# Patient Record
Sex: Male | Born: 1990 | Race: White | Hispanic: No | Marital: Single | Smoking: Never smoker
Health system: Southern US, Community
[De-identification: ages and names within clinical notes are randomized; demographics above are authoritative.]

## PROBLEM LIST (undated history)

## (undated) DIAGNOSIS — J302 Other seasonal allergic rhinitis: Secondary | ICD-10-CM

## (undated) DIAGNOSIS — K219 Gastro-esophageal reflux disease without esophagitis: Secondary | ICD-10-CM

## (undated) HISTORY — PX: NASAL SINUS SURGERY: SHX719

---

## 2013-02-26 ENCOUNTER — Emergency Department: Payer: Self-pay | Admitting: Emergency Medicine

## 2014-03-23 ENCOUNTER — Emergency Department: Payer: Self-pay | Admitting: Emergency Medicine

## 2014-03-23 LAB — CBC WITH DIFFERENTIAL/PLATELET
BASOS PCT: 0.6 %
Basophil #: 0.1 10*3/uL (ref 0.0–0.1)
Eosinophil #: 0.4 10*3/uL (ref 0.0–0.7)
Eosinophil %: 4.6 %
HCT: 45.1 % (ref 40.0–52.0)
HGB: 15.1 g/dL (ref 13.0–18.0)
LYMPHS ABS: 2.1 10*3/uL (ref 1.0–3.6)
LYMPHS PCT: 22.9 %
MCH: 30.1 pg (ref 26.0–34.0)
MCHC: 33.6 g/dL (ref 32.0–36.0)
MCV: 90 fL (ref 80–100)
MONO ABS: 0.8 x10 3/mm (ref 0.2–1.0)
Monocyte %: 8.6 %
NEUTROS ABS: 5.9 10*3/uL (ref 1.4–6.5)
NEUTROS PCT: 63.3 %
Platelet: 240 10*3/uL (ref 150–440)
RBC: 5.03 10*6/uL (ref 4.40–5.90)
RDW: 14 % (ref 11.5–14.5)
WBC: 9.3 10*3/uL (ref 3.8–10.6)

## 2014-03-23 LAB — BASIC METABOLIC PANEL
Anion Gap: 4 — ABNORMAL LOW (ref 7–16)
BUN: 14 mg/dL (ref 7–18)
CHLORIDE: 105 mmol/L (ref 98–107)
CO2: 31 mmol/L (ref 21–32)
CREATININE: 0.82 mg/dL (ref 0.60–1.30)
Calcium, Total: 9.1 mg/dL (ref 8.5–10.1)
EGFR (African American): >60
Glucose: 92 mg/dL (ref 65–99)
OSMOLALITY: 280 (ref 275–301)
Potassium: 4.1 mmol/L (ref 3.5–5.1)
Sodium: 140 mmol/L (ref 136–145)

## 2015-03-10 NOTE — Consult Note (Signed)
PATIENT NAME:  Troy DuverneyURNER, Reice MR#:  [id number] DATE OF BIRTH:  09-Nov-1991  DATE OF CONSULTATION:    REFERRING PHYSICIAN:   CONSULTING PHYSICIAN:  Zackery BarefootJ. Madison Aily Tzeng, MD  HISTORY OF PRESENT ILLNESS:  The patient is a very pleasant 24 year old gentleman who has a long history of chronic sinusitis and recurrent sinusitis and who woke up at approximately 3:30 this morning with severe eye pain.  He took ibuprofen and Benadryl and went back to sleep.  When he woke back up he presented [facility] inf[facility], was given Benadryl and Augmentin for a sinus infection.  His problem continued to progress at which time at approximately 6:00 this evening he presented to the Emergency Room.  A CT scan was obtained and I have personally reviewed it.  I do agree with the reading of the radiologist that there is a superiorly based abscess in the right orbit displacing the globe inferiorly and laterally.  The patient has been evaluated by the ophthalmologist (Dr. Larae GroomsMatt Appenzeller) and there is no apparent pupillary defect and his visual acuity is normal.  There is pain related limitation of globe and orbital movement.   ALLERGIES:  None.   MEDICATIONS:  Allegra.   PAST MEDICAL HISTORY:   1.  Inhalant allergies.  2.  Asthma.  3.  Recurrent sinusitis.   PAST SURGICAL HISTORY:  [place]inus [place] x 2[place]   PHYSICAL EXAMINATION: GENERAL:  The patient is very pleasant, answers questions appropriately.  Neurologically intact.  EYES:  There is significant periorbital edema and erythema on the right.  The left eye is normal.  The globe is displaced inferolaterally.  Slight limitation in movement in the inferior temporal quadrant.  NOSE:  The intranasal mucosa is diffusely inflamed.  There is polypoid degeneration at the frontal ethmoid recess bilaterally.  There is purulence within the polyps on the right.  ORAL CAVITY AND OROPHARYNX:  Clear.  NECK:  Shotty  adenopathy in levels two and three bilaterally, more significant on the right.   IMPRESSION:  Right superior orbital abscess.  It is unclear as to whether or not this is contiguous with the frontal ethmoid sinus disease, although there frontal ethmoid opacification bilaterally.  I have discussed the case with Dr. Champ MungoAppenzeller who in turn talked with [facility] Mickie KayAmy Fowler at [facility].  She is not completely comfortable with the approach to this type of abscess, therefore [facility] will [facility].  In the meantime, the patient will receive intravenous Rocephin, intravenous Decadron on a q. 6 hour basis and he will remain nothing by mouth.    ____________________________ J. Gertie BaronMadison Cimberly Stoffel, MD jmc:ea D: 03/23/2014 22:59:23 ET T: 03/23/2014 23:25:33 ET JOB#: [id number]  cc: Zackery BarefootJ. Madison Milinda Sweeney, MD, <Dictator> Wendee CoppJMADISON Chrysa Rampy MD ELECTRONICALLY SIGNED 04/16/2014 17:26

## 2015-03-10 NOTE — Consult Note (Signed)
CC: Swollen right [age] yo male presenting with swollen eyelid for past 24 hours or more on the right.  Loss of vision secondary to lid ptosis.  Pain on movement of eye.  Three weeks of sinus infection.  Recent Avelox for treatment.  No improvement with blinking.Annual sinus infections.  History of two sinus surgeries.Currently receiving Ceftaz in the ER.NoneCodeine, DemerolNo SmokingHx: Father with multiple sinus problemsNegative for Endo, GI, GU, Const, Skin, Neuro, MSK, Cardiac, Resp, Heme, Psych Exam: Swollen URL with minimal erythema, and moderate edema.  Vision 20/40 at near.  No APD.  Movement restricted in all four quadrants with pain on medial and lateral movement.  Globe displaced inferior and temporal.  Cornea clear, iris RR, Sclera and Conj White, Lens clear, AC clear Apparent sinus surgery history.  Periosteal abscess superiorly associated with roof.  Question Mucocele Supero-Nasal.  Displaced globe.  Globe intact. 1: Orbital Cellulitis with periosteal abscess.1) Broad Spectrum ABX such as Ceftaz +/- Nafcillin or VancoRef[facility] given history of sinus surgery and orbital roof involvement.  Will need coordination of ENT and Ocuplastics/Orbital Surgery.  Electronic Signatures: Aron BabaAppenzeller, Reannon Candella (MD)  (Signed on 07-May-15 22:00)  Authored  Last Updated: 07-May-15 22:00 by Aron BabaAppenzeller, Aiyonna Lucado (MD)

## 2015-04-11 ENCOUNTER — Encounter (HOSPITAL_COMMUNITY): Payer: Self-pay | Admitting: *Deleted

## 2015-04-11 ENCOUNTER — Emergency Department (HOSPITAL_COMMUNITY)
Admission: EM | Admit: 2015-04-11 | Discharge: 2015-04-11 | Disposition: A | Discharge status: Home / Self Care | Payer: Managed Care, Other (non HMO) | Source: Home / Self Care | Attending: Family Medicine | Admitting: Family Medicine

## 2015-04-11 DIAGNOSIS — M940 Chondrocostal junction syndrome [Tietze]: Secondary | ICD-10-CM

## 2015-04-11 DIAGNOSIS — K219 Gastro-esophageal reflux disease without esophagitis: Secondary | ICD-10-CM

## 2015-04-11 DIAGNOSIS — R079 Chest pain, unspecified: Secondary | ICD-10-CM

## 2015-04-11 HISTORY — DX: Gastro-esophageal reflux disease without esophagitis: K21.9

## 2015-04-11 HISTORY — DX: Other seasonal allergic rhinitis: J30.2

## 2015-04-11 MED ORDER — GI COCKTAIL ~~LOC~~
ORAL | Status: AC
  Filled 2015-04-11: qty 30

## 2015-04-11 MED ORDER — GI COCKTAIL ~~LOC~~: 30.0000 mL | Freq: Once | ORAL | Status: DC

## 2015-04-11 NOTE — ED Notes (Signed)
Pt is here with complaints of 2-3 weeks of fleeting chest pain. Reports pain happens multiple times per day. States it only lasts for a few seconds at a time. Pt denies any additional symptoms. Pt does have history of GERD. States he is "always stressed" with college.

## 2015-04-11 NOTE — ED Provider Notes (Signed)
CSN: [id number]     Arrival date & time 04/11/15  1108 History   First MD Initiated Contact with Patient 04/11/15 1255     Chief Complaint  Patient presents with  . Chest Pain   (Consider location/radiation/quality/duration/timing/severity/associated sxs/prior Treatment) HPI Chest pain. cetner of chest. Sharp and dull. Started a couple weeks ago. Off and non for years. Lasts a couple seconds but occurs multiple times per day. Not worse w/ exertion. Nothing makes symptoms better. Advil w/o benefit. No change w/ deep breathing. She states that he took the [employer] 2 weeks ago which was extremely stressful and patient has also started lifting weights again. Denies nausea, vomiting, lightheadedness, palpitations, radiation to the neck or shoulder or back of the pain, syncope, shortness of breath, fevers, nausea, vomiting, neck stiffness, headache.   Past Medical History  Diagnosis Date  . GERD (gastroesophageal reflux disease)   . Seasonal allergies    Past Surgical History  Procedure Laterality Date  . Nasal sinus surgery     Family History  Problem Relation Age of Onset  . Hypertension Mother   . Diabetes Father   . Cancer Father     prostate  . Hypertension Father    History  Substance Use Topics  . Smoking status: Never Smoker   . Smokeless tobacco: Not on file  . Alcohol Use: Yes     Comment: occasional    Review of Systems Per HPI with all other pertinent systems negative.    Allergies  Codeine and Demerol  Home Medications   Prior to Admission medications   Medication Sig Start Date End Date Taking? Authorizing Provider  esomeprazole (NEXIUM) 20 MG capsule Take 40 mg by mouth daily at 12 noon.   Yes Historical Provider, MD  fexofenadine (ALLEGRA) 30 MG tablet Take 30 mg by mouth 2 (two) times daily.   Yes Historical Provider, MD   BP 146/86 mmHg  Pulse 68  Temp(Src) 98.6 F (37 C) (Oral)  SpO2 99% Physical Exam Physical Exam  Constitutional: oriented to  person, place, and time. appears well-developed and well-nourished. No distress.  HENT:  Head: Normocephalic and atraumatic.  Eyes: EOMI. PERRL.  Neck: Normal range of motion.  Cardiovascular: RRR, no m/r/g, 2+ distal pulses,  Pulmonary/Chest: Effort normal and breath sounds normal. No respiratory distress.  Abdominal: Soft. Bowel sounds are normal. NonTTP, no distension.  Musculoskeletal: Tenderness to palpation of the costochondral border bilaterally. Neurological: alert and oriented to person, place, and time.  Skin: Skin is warm. No rash noted. non diaphoretic.  Psychiatric: normal mood and affect. behavior is normal. Judgment and thought content normal.   ED Course  Procedures (including critical care time) Labs Review Labs Reviewed - No data to display  Imaging Review No results found.   MDM   1. Costochondritis   2. Chest pain, unspecified chest pain type   3. Gastroesophageal reflux disease, esophagitis presence not specified    Patient is very anxious at this point in time. Suspect some of his chest pain is likely due to his stress and anxiety level in addition to some underlying gastric reflux and esophagitis. Patient does not think is related to reflux and does not want a GI cocktail to help rule out out. Patient's tenderness to palpation of his chest wall likely demonstrates that his pain is still skeletal in nature and will be best treated with anti-inflammatories\, and change in physical behavior. Patient aware that anti-inflammatories may worsen his reflux. Patient go to the emergency room  if he gets significantly worse.    Ozella Rocks, MD 04/11/15 [phone]

## 2015-04-11 NOTE — Discharge Instructions (Signed)
It is highly unlikely that the cause of your chest pain is heart related. Your likely suffering from irritation of the costochondral border called costochondritis. Your reflux and stress are also likely contributing to your symptoms. Please consider using an anti-inflammatory medicine to help with the inflammation of your chest. You could consider 600 mg of ibuprofen every 6 hours or 1-2 Aleve every 12 hours. Consider adding additional reflux medications to your regimen such as Carafate or Zantac. Please follow-up with your primary care physician or the emergency room if you get significantly worse.

## 2015-05-13 ENCOUNTER — Emergency Department (HOSPITAL_COMMUNITY)
Admission: EM | Admit: 2015-05-13 | Discharge: 2015-05-13 | Disposition: A | Discharge status: Home / Self Care | Payer: Managed Care, Other (non HMO) | Attending: Emergency Medicine | Admitting: Emergency Medicine

## 2015-05-13 ENCOUNTER — Encounter (HOSPITAL_COMMUNITY): Payer: Self-pay

## 2015-05-13 ENCOUNTER — Emergency Department (HOSPITAL_COMMUNITY): Payer: Managed Care, Other (non HMO)

## 2015-05-13 DIAGNOSIS — W19XXXA Unspecified fall, initial encounter: Secondary | ICD-10-CM

## 2015-05-13 DIAGNOSIS — F10129 Alcohol abuse with intoxication, unspecified: Secondary | ICD-10-CM | POA: Diagnosis not present

## 2015-05-13 DIAGNOSIS — Z7951 Long term (current) use of inhaled steroids: Secondary | ICD-10-CM | POA: Insufficient documentation

## 2015-05-13 DIAGNOSIS — S0101XA Laceration without foreign body of scalp, initial encounter: Secondary | ICD-10-CM

## 2015-05-13 DIAGNOSIS — S060X1A Concussion with loss of consciousness of 30 minutes or less, initial encounter: Secondary | ICD-10-CM | POA: Diagnosis not present

## 2015-05-13 DIAGNOSIS — Y9301 Activity, walking, marching and hiking: Secondary | ICD-10-CM | POA: Diagnosis not present

## 2015-05-13 DIAGNOSIS — K219 Gastro-esophageal reflux disease without esophagitis: Secondary | ICD-10-CM | POA: Diagnosis not present

## 2015-05-13 DIAGNOSIS — W01198A Fall on same level from slipping, tripping and stumbling with subsequent striking against other object, initial encounter: Secondary | ICD-10-CM | POA: Insufficient documentation

## 2015-05-13 DIAGNOSIS — Y999 Unspecified external cause status: Secondary | ICD-10-CM | POA: Insufficient documentation

## 2015-05-13 DIAGNOSIS — Z79899 Other long term (current) drug therapy: Secondary | ICD-10-CM | POA: Diagnosis not present

## 2015-05-13 DIAGNOSIS — S0990XA Unspecified injury of head, initial encounter: Secondary | ICD-10-CM | POA: Diagnosis present

## 2015-05-13 DIAGNOSIS — Y92480 Sidewalk as the place of occurrence of the external cause: Secondary | ICD-10-CM | POA: Insufficient documentation

## 2015-05-13 MED ORDER — TETANUS-DIPHTH-ACELL PERTUSSIS 5-2.5-18.5 LF-MCG/0.5 IM SUSP
0.5000 mL | Freq: Once | INTRAMUSCULAR | Status: AC
  Administered 2015-05-13: 0.5 mL via INTRAMUSCULAR
  Filled 2015-05-13: qty 0.5

## 2015-05-13 MED ORDER — LIDOCAINE-EPINEPHRINE (PF) 2 %-1:200000 IJ SOLN
10.0000 mL | Freq: Once | INTRAMUSCULAR | Status: DC
  Filled 2015-05-13: qty 20

## 2015-05-13 NOTE — ED Notes (Signed)
MD at bedside. 

## 2015-05-13 NOTE — ED Notes (Addendum)
Per PTAR, [place] and [place] a tree and fell and hit his head on the corner of a metal sign and then fell back and hit his head on the concrete. Pt did lose consciousness. Pt was alert on scene with EMS. Pt has a laceration on the left side of the head. Bandage placed over laceration bleeding controlled. Pt had large amount of etoh to drink prior to fall. Pt sobbing upon arrival saying "leave me alone" repeatedly. Pt stable and in NAD. EMS VS 110/60, HR 118, Sat 98%, RR 18. Pt allergic to codeine and demeral. Pt on LSB, headblocks and neck brace in place.

## 2015-05-13 NOTE — ED Provider Notes (Signed)
CSN: [id number]     Arrival date & time 05/13/15  0252 History   This chart was scribed for Derwood Kaplan, MD by Arlan Organ, ED Scribe. This patient was seen in room A01C/A01C and the patient's care was started 3:33 AM.   Chief Complaint  Patient presents with  . Fall  . Head Laceration   The history is provided by the patient. No language interpreter was used.    HPI Comments: Troy Fuller is a 24 y.o. male who presents to the Emergency Department here after a fall sustained just prior to arrival. Pt states he was [place] when he tripped over a tree resulting in him falling and hitting his head against the corner of a metel sign. Pt then fell back and hit the back of his head against the concrete. Roommate reports a short episode of LOC after fall. [name] now presents with an open wound to the L side of the head. Bleeding controlled at this time. [name] admits to consuming a large amount of alcohol this evening prior to fall. He is unaware of Tetanus status. Pt with known allergies to Codeine and Demerol.   Past Medical History  Diagnosis Date  . GERD (gastroesophageal reflux disease)   . Seasonal allergies    Past Surgical History  Procedure Laterality Date  . Nasal sinus surgery     Family History  Problem Relation Age of Onset  . Hypertension Mother   . Diabetes Father   . Cancer Father     prostate  . Hypertension Father    History  Substance Use Topics  . Smoking status: Never Smoker   . Smokeless tobacco: Not on file  . Alcohol Use: Yes     Comment: occasional    Review of Systems  Constitutional: Negative for fever and chills.  Respiratory: Negative for shortness of breath.   Cardiovascular: Negative for chest pain.  Musculoskeletal: Negative for back pain and arthralgias.  Skin: Positive for wound.  All other systems reviewed and are negative.     Allergies  Codeine and Demerol  Home Medications   Prior to  Admission medications   Medication Sig Start Date End Date Taking? Authorizing Provider  esomeprazole (NEXIUM) 20 MG capsule Take 40 mg by mouth daily at 12 noon.    Historical Provider, MD  fexofenadine (ALLEGRA) 30 MG tablet Take 30 mg by mouth 2 (two) times daily.    Historical Provider, MD  fluticasone (FLONASE) 50 MCG/ACT nasal spray Place 1 spray into both nostrils daily. 04/18/15   Historical Provider, MD   Triage Vitals: BP 121/56 mmHg  Pulse 89  Temp(Src) 97.7 F (36.5 C) (Oral)  Resp 18  SpO2 94%   Physical Exam  Constitutional: He is oriented to person, place, and time. He appears well-developed and well-nourished.  HENT:  Head: Normocephalic and atraumatic.  Eyes: EOM are normal.  Neck: Normal range of motion.  Cardiovascular: Normal rate, regular rhythm, normal heart sounds and intact distal pulses.   Pulmonary/Chest: Effort normal and breath sounds normal. No respiratory distress.  Abdominal: Soft. He exhibits no distension. There is no tenderness.  Musculoskeletal: Normal range of motion.  No midline C spine tenderness. No facial abrasions, step offs, crepitus, no tenderness to palpation of the bilateral upper and lower extremities, no gross deformities, no chest tenderness, no pelvic pain.   Neurological: He is alert and oriented to person, place, and time.  Skin: Skin is warm and dry.  Deep  13 cm laceration to parietal temporal   Psychiatric: He has a normal mood and affect. Judgment normal.  Nursing note and vitals reviewed.   ED Course  Procedures (including critical care time)  DIAGNOSTIC STUDIES: Oxygen Saturation is 100% on RA, Normal by my interpretation.    COORDINATION OF CARE: 3:37 AM- Will give Boostrix. Will order DG Cervical spine complete and CT head without contrast. Discussed treatment plan with pt at bedside and pt agreed to plan.     Labs Review Labs Reviewed - No data to display  Imaging Review Dg Cervical Spine Complete  05/13/2015    CLINICAL DATA:  Status post fall. Concern for neck injury. Initial encounter.  EXAM: CERVICAL SPINE  4+ VIEWS  COMPARISON:  None.  FINDINGS: There is no evidence of fracture or subluxation. Vertebral bodies demonstrate normal height and alignment. Intervertebral disc spaces are preserved. Prevertebral soft tissues are within normal limits. The provided odontoid view demonstrates no significant abnormality.  The visualized lung apices are clear.  IMPRESSION: No evidence of fracture or subluxation along the cervical spine.   Electronically Signed   By: Roanna Raider M.D.   On: 05/13/2015 04:20   Ct Head Wo Contrast  05/13/2015   CLINICAL DATA:  Tripped over a tree, and hit head on corner of metal sign. Fell backward and hit head on concrete. Loss of consciousness. Laceration on the left side of the head. Initial encounter.  EXAM: CT HEAD WITHOUT CONTRAST  TECHNIQUE: Contiguous axial images were obtained from the base of the skull through the vertex without intravenous contrast.  COMPARISON:  CT of the maxillofacial structures performed 03/23/2014  FINDINGS: There is no evidence of acute infarction, mass lesion, or intra- or extra-axial hemorrhage on CT.  The posterior fossa, including the cerebellum, brainstem and fourth ventricle, is within normal limits. The third and lateral ventricles, and basal ganglia are unremarkable in appearance. The cerebral hemispheres are symmetric in appearance, with normal gray-white differentiation. No mass effect or midline shift is seen.  There is no evidence of fracture; visualized osseous structures are unremarkable in appearance. The visualized portions of the orbits are within normal limits. Mucosal thickening is noted at the right maxillary sinus; the patient is status post right-sided maxillary antrostomy. The remaining paranasal sinuses and mastoid air cells are well-aerated. A large soft tissue laceration is noted overlying the left parietal calvarium, with air tracking to  the level of the calvarium.  IMPRESSION: 1. No evidence of traumatic intracranial injury or fracture. 2. Large soft tissue laceration overlying the left parietal calvarium, with air tracking to the level of the calvarium. 3. Mucosal thickening at the right maxillary sinus.   Electronically Signed   By: Roanna Raider M.D.   On: 05/13/2015 04:27     EKG Interpretation None      LACERATION REPAIR Performed by: Derwood Kaplan Authorized by: Derwood Kaplan Consent: Verbal consent obtained. Risks and benefits: risks, benefits and alternatives were discussed Consent given by: patient Patient identity confirmed: provided demographic data Prepped and Draped in normal sterile fashion Wound explored  Laceration Location: Scalp  Laceration Length: 14 cm  No Foreign Bodies seen or palpated  Anesthesia: NONE   Irrigation method: syringe Amount of cleaning: standard  Skin closure: PRIMARY  Number of STAPLES: 6   Patient tolerance: Patient tolerated the procedure well with no immediate complications.   MDM   Final diagnoses:  Fall  Concussion, with loss of consciousness of 30 minutes or less, initial encounter  Scalp  laceration, initial encounter    I personally performed the services described in this documentation, which was scribed in my presence. The recorded information has been reviewed and is accurate.  Pt is intoxicated, and had a fall. He hit some metallic object, has a deep gash and had LOC. Staples placed to the scalp. CT head and Xray cspine is neg. Very low probability for c spine injury based on the neg xrays - clearing him clinically, as he is moving all 4 and walking. He removed his c-collar already. Discharge instructions discussed with pt and his roommate.     Derwood Kaplan, MD 05/13/15 [phone]

## 2015-05-13 NOTE — Discharge Instructions (Signed)
We saw you in the ER for your scalp wound. Please read the instructions provided on wound care. Keep the area clean and dry, apply neosporin ointment daily. RETURN TO THE ER IF THERE IS INCREASED PAIN, REDNESS, PUS COMING OUT from the wound site.   Concussion A concussion, or closed-head injury, is a brain injury caused by a direct blow to the head or by a quick and sudden movement (jolt) of the head or neck. Concussions are usually not life-threatening. Even so, the effects of a concussion can be serious. If you have had a concussion before, you are more likely to experience concussion-like symptoms after a direct blow to the head.  CAUSES  Direct blow to the head, such as from running into another player during a soccer game, being hit in a fight, or hitting your head on a hard surface.  A jolt of the head or neck that causes the brain to move back and forth inside the skull, such as in a car crash. SIGNS AND SYMPTOMS The signs of a concussion can be hard to notice. Early on, they may be missed by you, family members, and health care providers. You may look fine but act or feel differently. Symptoms are usually temporary, but they may last for days, weeks, or even longer. Some symptoms may appear right away while others may not show up for hours or days. Every head injury is different. Symptoms include:  Mild to moderate headaches that will not go away.  A feeling of pressure inside your head.  Having more trouble than usual:  Learning or remembering things you have heard.  Answering questions.  Paying attention or concentrating.  Organizing daily tasks.  Making decisions and solving problems.  Slowness in thinking, acting or reacting, speaking, or reading.  Getting lost or being easily confused.  Feeling tired all the time or lacking energy (fatigued).  Feeling drowsy.  Sleep disturbances.  Sleeping more than usual.  Sleeping less than usual.  Trouble falling  asleep.  Trouble sleeping (insomnia).  Loss of balance or feeling lightheaded or dizzy.  Nausea or vomiting.  Numbness or tingling.  Increased sensitivity to:  Sounds.  Lights.  Distractions.  Vision problems or eyes that tire easily.  Diminished sense of taste or smell.  Ringing in the ears.  Mood changes such as feeling sad or anxious.  Becoming easily irritated or angry for little or no reason.  Lack of motivation.  Seeing or hearing things other people do not see or hear (hallucinations). DIAGNOSIS Your health care provider can usually diagnose a concussion based on a description of your injury and symptoms. He or she will ask whether you passed out (lost consciousness) and whether you are having trouble remembering events that happened right before and during your injury. Your evaluation might include:  A brain scan to look for signs of injury to the brain. Even if the test shows no injury, you may still have a concussion.  Blood tests to be sure other problems are not present. TREATMENT  Concussions are usually treated in an emergency department, in urgent care, or at a clinic. You may need to stay in the hospital overnight for further treatment.  Tell your health care provider if you are taking any medicines, including prescription medicines, over-the-counter medicines, and natural remedies. Some medicines, such as blood thinners (anticoagulants) and aspirin, may increase the chance of complications. Also tell your health care provider whether you have had alcohol or are taking illegal drugs. This  information may affect treatment.  Your health care provider will send you home with important instructions to follow.  How fast you will recover from a concussion depends on many factors. These factors include how severe your concussion is, what part of your brain was injured, your age, and how healthy you were before the concussion.  Most people with mild injuries  recover fully. Recovery can take time. In general, recovery is slower in older persons. Also, persons who have had a concussion in the past or have other medical problems may find that it takes longer to recover from their current injury. HOME CARE INSTRUCTIONS General Instructions  Carefully follow the directions your health care provider gave you.  Only take over-the-counter or prescription medicines for pain, discomfort, or fever as directed by your health care provider.  Take only those medicines that your health care provider has approved.  Do not drink alcohol until your health care provider says you are well enough to do so. Alcohol and certain other drugs may slow your recovery and can put you at risk of further injury.  If it is harder than usual to remember things, write them down.  If you are easily distracted, try to do one thing at a time. For example, do not try to watch TV while fixing dinner.  Talk with family members or close friends when making important decisions.  Keep all follow-up appointments. Repeated evaluation of your symptoms is recommended for your recovery.  Watch your symptoms and tell others to do the same. Complications sometimes occur after a concussion. Older adults with a brain injury may have a higher risk of serious complications, such as a blood clot on the brain.  Tell your teachers, school nurse, school counselor, coach, athletic trainer, or work Production designer, theatre/television/film about your injury, symptoms, and restrictions. Tell them about what you can or cannot do. They should watch for:  Increased problems with attention or concentration.  Increased difficulty remembering or learning new information.  Increased time needed to complete tasks or assignments.  Increased irritability or decreased ability to cope with stress.  Increased symptoms.  Rest. Rest helps the brain to heal. Make sure you:  Get plenty of sleep at night. Avoid staying up late at night.  Keep  the same bedtime hours on weekends and weekdays.  Rest during the day. Take daytime naps or rest breaks when you feel tired.  Limit activities that require a lot of thought or concentration. These include:  Doing homework or job-related work.  Watching TV.  Working on the computer.  Avoid any situation where there is potential for another head injury (football, hockey, soccer, basketball, martial arts, downhill snow sports and horseback riding). Your condition will get worse every time you experience a concussion. You should avoid these activities until you are evaluated by the appropriate follow-up health care providers. Returning To Your Regular Activities You will need to return to your normal activities slowly, not all at once. You must give your body and brain enough time for recovery.  Do not return to sports or other athletic activities until your health care provider tells you it is safe to do so.  Ask your health care provider when you can drive, ride a bicycle, or operate heavy machinery. Your ability to react may be slower after a brain injury. Never do these activities if you are dizzy.  Ask your health care provider about when you can return to work or school. Preventing Another Concussion It is very important  to avoid another brain injury, especially before you have recovered. In rare cases, another injury can lead to permanent brain damage, brain swelling, or death. The risk of this is greatest during the first 7-10 days after a head injury. Avoid injuries by:  Wearing a seat belt when riding in a car.  Drinking alcohol only in moderation.  Wearing a helmet when biking, skiing, skateboarding, skating, or doing similar activities.  Avoiding activities that could lead to a second concussion, such as contact or recreational sports, until your health care provider says it is okay.  Taking safety measures in your home.  Remove clutter and tripping hazards from floors and  stairways.  Use grab bars in bathrooms and handrails by stairs.  Place non-slip mats on floors and in bathtubs.  Improve lighting in dim areas. SEEK MEDICAL CARE IF:  You have increased problems paying attention or concentrating.  You have increased difficulty remembering or learning new information.  You need more time to complete tasks or assignments than before.  You have increased irritability or decreased ability to cope with stress.  You have more symptoms than before. Seek medical care if you have any of the following symptoms for more than 2 weeks after your injury:  Lasting (chronic) headaches.  Dizziness or balance problems.  Nausea.  Vision problems.  Increased sensitivity to noise or light.  Depression or mood swings.  Anxiety or irritability.  Memory problems.  Difficulty concentrating or paying attention.  Sleep problems.  Feeling tired all the time. SEEK IMMEDIATE MEDICAL CARE IF:  You have severe or worsening headaches. These may be a sign of a blood clot in the brain.  You have weakness (even if only in one hand, leg, or part of the face).  You have numbness.  You have decreased coordination.  You vomit repeatedly.  You have increased sleepiness.  One pupil is larger than the other.  You have convulsions.  You have slurred speech.  You have increased confusion. This may be a sign of a blood clot in the brain.  You have increased restlessness, agitation, or irritability.  You are unable to recognize people or places.  You have neck pain.  It is difficult to wake you up.  You have unusual behavior changes.  You lose consciousness. MAKE SURE YOU:  Understand these instructions.  Will watch your condition.  Will get help right away if you are not doing well or get worse. Document Released: 01/24/2004 Document Revised: 11/08/2013 Document Reviewed: 05/26/2013 [facility] Patient Information 2015 [place], [place]. This  information is not intended to replace advice given to you by your health care provider. Make sure you discuss any questions you have with your health care provider.  Stitches, Staples, or Skin Adhesive Strips  Stitches (sutures), staples, and skin adhesive strips hold the skin together as it heals. They will usually be in place for 7 days or less. HOME CARE  Wash your hands with soap and water before and after you touch your wound.  Only take medicine as told by your doctor.  Cover your wound only if your doctor told you to. Otherwise, leave it open to air.  Do not get your stitches wet or dirty. If they get dirty, dab them gently with a clean washcloth. Wet the washcloth with soapy water. Do not rub. Pat them dry gently.  Do not put medicine or medicated cream on your stitches unless your doctor told you to.  Do not take out your own stitches or  staples. Skin adhesive strips will fall off by themselves.  Do not pick at the wound. Picking can cause an infection.  Do not miss your follow-up appointment.  If you have problems or questions, call your doctor. GET HELP RIGHT AWAY IF:   You have a temperature by mouth above 102 F (38.9 C), not controlled by medicine.  You have chills.  You have redness or pain around your stitches.  There is puffiness (swelling) around your stitches.  You notice fluid (drainage) from your stitches.  There is a bad smell coming from your wound. MAKE SURE YOU:  Understand these instructions.  Will watch your condition.  Will get help if you are not doing well or get worse. Document Released: 08/31/2009 Document Revised: 01/26/2012 Document Reviewed: 08/31/2009 [facility] Patient Information 2015 [place], [place]. This information is not intended to replace advice given to you by your health care provider. Make sure you discuss any questions you have with your health care provider.

## 2015-05-25 ENCOUNTER — Encounter (HOSPITAL_COMMUNITY): Payer: Self-pay | Admitting: *Deleted

## 2015-05-25 ENCOUNTER — Emergency Department (HOSPITAL_COMMUNITY)
Admission: EM | Admit: 2015-05-25 | Discharge: 2015-05-25 | Disposition: A | Discharge status: Home / Self Care | Payer: Managed Care, Other (non HMO) | Attending: Emergency Medicine | Admitting: Emergency Medicine

## 2015-05-25 DIAGNOSIS — Z79899 Other long term (current) drug therapy: Secondary | ICD-10-CM | POA: Diagnosis not present

## 2015-05-25 DIAGNOSIS — Z4802 Encounter for removal of sutures: Secondary | ICD-10-CM | POA: Diagnosis present

## 2015-05-25 DIAGNOSIS — K219 Gastro-esophageal reflux disease without esophagitis: Secondary | ICD-10-CM | POA: Insufficient documentation

## 2015-05-25 DIAGNOSIS — Z7951 Long term (current) use of inhaled steroids: Secondary | ICD-10-CM | POA: Diagnosis not present

## 2015-05-25 NOTE — ED Provider Notes (Signed)
CSN: [id number]     Arrival date & time 05/25/15  1947 History   This chart was scribed for non-physician practitioner Fayrene Helper, PA-C working with Purvis Sheffield, MD by Lyndel Safe, ED Scribe. This patient was seen in room TR10C/TR10C and the patient's care was started at 8:00 PM.     Chief Complaint  Patient presents with  . Suture / Staple Removal   The history is provided by the patient. No language interpreter was used.    HPI Comments: Troy Fuller is a 24 y.o. male who presents to the Emergency Department to be evaluated for staple removal from left-sided parietal region of head. Pt fell and hit the corner of a metal sign 2 weeks ago when he received 6 staples to the 14 cm laceration he sustained during from the fall. He reports LOC with the fall. He denies any worsening of the pain or drainage from the area.   Past Medical History  Diagnosis Date  . GERD (gastroesophageal reflux disease)   . Seasonal allergies    Past Surgical History  Procedure Laterality Date  . Nasal sinus surgery     Family History  Problem Relation Age of Onset  . Hypertension Mother   . Diabetes Father   . Cancer Father     prostate  . Hypertension Father    History  Substance Use Topics  . Smoking status: Never Smoker   . Smokeless tobacco: Not on file  . Alcohol Use: Yes     Comment: occasional    Review of Systems  Musculoskeletal: Negative for arthralgias.  Skin: Positive for wound.    Allergies  Codeine and Demerol  Home Medications   Prior to Admission medications   Medication Sig Start Date End Date Taking? Authorizing Provider  esomeprazole (NEXIUM) 20 MG capsule Take 40 mg by mouth daily at 12 noon.    Historical Provider, MD  fexofenadine (ALLEGRA) 30 MG tablet Take 30 mg by mouth 2 (two) times daily.    Historical Provider, MD  fluticasone (FLONASE) 50 MCG/ACT nasal spray Place 1 spray into both nostrils daily. 04/18/15   Historical Provider, MD   Pulse 60   Temp(Src) 97.2 F (36.2 C) (Oral)  Resp 14  Ht  (1.676 m)  Wt 220 lb (99.791 kg)  BMI 35.53 kg/m2  SpO2 98% Physical Exam  Constitutional: He appears well-developed and well-nourished.  HENT:  Head: Normocephalic and atraumatic.  Well healing laceration noted to the left parietal region with staples in place;No significant tenderness on palpation; No erythema; No signs of infection.   Eyes: Conjunctivae are normal. Right eye exhibits no discharge. Left eye exhibits no discharge.  Pulmonary/Chest: Effort normal. No respiratory distress.  Neurological: He is alert. Coordination normal.  Skin: Skin is warm and dry. No rash noted. He is not diaphoretic. No erythema.  Psychiatric: He has a normal mood and affect.  Nursing note and vitals reviewed.   ED Course  Procedures  DIAGNOSTIC STUDIES: Oxygen Saturation is 98% on RA, normal by my interpretation.    COORDINATION OF CARE: 8:07 PM Discussed treatment plan which includes to remove the staples with pt. Pt acknowledges and agrees to plan.   STAPLES REMOVAL Performed by: Fayrene Helper  Consent: Verbal consent obtained. Patient identity confirmed: provided demographic data Time out: Immediately prior to procedure a "time out" was called to verify the correct patient, procedure, equipment, support staff and site/side marked as required.  Location details: L parietal region, scalp  Wound Appearance: clean  Sutures/Staples Removed: staples  Facility: sutures placed in this facility Patient tolerance: Patient tolerated the procedure well with no immediate complications.     Labs Review Labs Reviewed - No data to display  Imaging Review No results found.   EKG Interpretation None      MDM   Final diagnoses:  Encounter for staple removal    BP 117/70 mmHg  Pulse 60  Temp(Src) 97.2 F (36.2 C) (Oral)  Resp 14  Ht 5\' 6"  (1.676 m)  Wt 220 lb (99.791 kg)  BMI 35.53 kg/m2  SpO2 98%   I personally performed  the services described in this documentation, which was scribed in my presence. The recorded information has been reviewed and is accurate.     Fayrene HelperBowie Mayur Duman, PA-C 05/25/15 2010  Purvis SheffieldForrest Harrison, MD 05/26/15 [phone]

## 2015-05-25 NOTE — Discharge Instructions (Signed)

## 2015-05-25 NOTE — ED Notes (Signed)
Pt requesting staples be removed from the left side of head.

## 2015-12-22 ENCOUNTER — Emergency Department (HOSPITAL_COMMUNITY): Payer: BLUE CROSS/BLUE SHIELD

## 2015-12-22 ENCOUNTER — Emergency Department (HOSPITAL_COMMUNITY)
Admission: EM | Admit: 2015-12-22 | Discharge: 2015-12-22 | Disposition: A | Discharge status: Home / Self Care | Payer: BLUE CROSS/BLUE SHIELD | Attending: Emergency Medicine | Admitting: Emergency Medicine

## 2015-12-22 ENCOUNTER — Encounter (HOSPITAL_COMMUNITY): Payer: Self-pay | Admitting: *Deleted

## 2015-12-22 DIAGNOSIS — Y9289 Other specified places as the place of occurrence of the external cause: Secondary | ICD-10-CM | POA: Insufficient documentation

## 2015-12-22 DIAGNOSIS — K219 Gastro-esophageal reflux disease without esophagitis: Secondary | ICD-10-CM | POA: Diagnosis not present

## 2015-12-22 DIAGNOSIS — W19XXXA Unspecified fall, initial encounter: Secondary | ICD-10-CM

## 2015-12-22 DIAGNOSIS — Y9389 Activity, other specified: Secondary | ICD-10-CM | POA: Diagnosis not present

## 2015-12-22 DIAGNOSIS — W1781XA Fall down embankment (hill), initial encounter: Secondary | ICD-10-CM | POA: Diagnosis not present

## 2015-12-22 DIAGNOSIS — Z79899 Other long term (current) drug therapy: Secondary | ICD-10-CM | POA: Diagnosis not present

## 2015-12-22 DIAGNOSIS — S99921A Unspecified injury of right foot, initial encounter: Secondary | ICD-10-CM

## 2015-12-22 DIAGNOSIS — Y998 Other external cause status: Secondary | ICD-10-CM | POA: Insufficient documentation

## 2015-12-22 MED ORDER — KETOROLAC TROMETHAMINE 30 MG/ML IJ SOLN
30.0000 mg | Freq: Once | INTRAMUSCULAR | Status: AC
  Administered 2015-12-22: 30 mg via INTRAMUSCULAR
  Filled 2015-12-22: qty 1

## 2015-12-22 MED ORDER — NAPROXEN 500 MG PO TABS: 500.0000 mg | ORAL_TABLET | Freq: Two times a day (BID) | ORAL | Status: AC

## 2015-12-22 MED ORDER — ACETAMINOPHEN 325 MG PO TABS: 650.0000 mg | ORAL_TABLET | Freq: Four times a day (QID) | ORAL | Status: AC | PRN

## 2015-12-22 NOTE — ED Notes (Signed)
Declined W/C at D/C and was escorted to lobby by RN. 

## 2015-12-22 NOTE — ED Provider Notes (Signed)
CSN: [id number]     Arrival date & time 12/22/15  0800 History   First MD Initiated Contact with Patient 12/22/15 [phone]     Chief Complaint  Patient presents with  . Ankle Pain    HPI   Troy Fuller is an 25 y.o. male who presents to the ED for evaluation of an ankle injury. He states he was walking on a sidewalk around 1AM last night and slipped down a hill, rolling his ankle. States it was an inversion injury. He states he did fall but did not hit his head or lose consciousness. He was able to ambulate immediately though it was painful to bear weight. He states his right ankle and lateral edge of his foot is sore. He has not tried anything to alleviate his symptoms. Denies numbness, weakness, tingling.   Past Medical History  Diagnosis Date  . GERD (gastroesophageal reflux disease)   . Seasonal allergies    Past Surgical History  Procedure Laterality Date  . Nasal sinus surgery     Family History  Problem Relation Age of Onset  . Hypertension Mother   . Diabetes Father   . Cancer Father     prostate  . Hypertension Father    Social History  Substance Use Topics  . Smoking status: Never Smoker   . Smokeless tobacco: Not on file  . Alcohol Use: Yes     Comment: occasional    Review of Systems  All other systems reviewed and are negative.     Allergies  Codeine and Demerol  Home Medications   Prior to Admission medications   Medication Sig Start Date End Date Taking? Authorizing Provider  esomeprazole (NEXIUM) 20 MG capsule Take 40 mg by mouth daily at 12 noon.    Historical Provider, MD  fexofenadine (ALLEGRA) 30 MG tablet Take 30 mg by mouth 2 (two) times daily.    Historical Provider, MD  fluticasone (FLONASE) 50 MCG/ACT nasal spray Place 1 spray into both nostrils daily. 04/18/15   Historical Provider, MD   BP 130/70 mmHg  Pulse 76  Temp(Src) 97.6 F (36.4 C) (Oral)  Resp 18  Ht  (1.651 m)  Wt 104.327 kg  BMI 38.27 kg/m2  SpO2 99% Physical Exam    Constitutional: He is oriented to person, place, and time. No distress.  HENT:  Head: Atraumatic.  Right Ear: External ear normal.  Left Ear: External ear normal.  Nose: Nose normal.  Mouth/Throat: Oropharynx is clear and moist. No oropharyngeal exudate.  Eyes: Conjunctivae and EOM are normal. Pupils are equal, round, and reactive to light. No scleral icterus.  Neck: Normal range of motion. Neck supple.  Cardiovascular: Normal rate, regular rhythm, normal heart sounds and intact distal pulses.   Pulmonary/Chest: Effort normal and breath sounds normal. No respiratory distress. He has no wheezes. He exhibits no tenderness.  Abdominal: Soft. There is no tenderness.  Musculoskeletal:       Feet:  Neurological: He is alert and oriented to person, place, and time. No cranial nerve deficit.  Skin: Skin is warm and dry. He is not diaphoretic.  Psychiatric: He has a normal mood and affect. His behavior is normal.  Nursing note and vitals reviewed.   ED Course  Procedures (including critical care time) Labs Review Labs Reviewed - No data to display  Imaging Review Dg Ankle Complete Right  12/22/2015  CLINICAL DATA:  Tripped walking down sidewalk today. Fall. Rolled right ankle with lateral pain and swelling. Initial encounter.  EXAM: RIGHT ANKLE - COMPLETE 3+ VIEW COMPARISON:  None. FINDINGS: There is soft tissue swelling about the lateral aspect of the hindfoot/ midfoot. The ankle mortise is intact. No acute fracture or dislocation is identified. No lytic or blastic osseous lesion or radiopaque foreign body is seen. IMPRESSION: Soft tissue swelling without acute osseous abnormality identified. Electronically Signed   By: Sebastian Ache M.D.   On: 12/22/2015 09:29   I have personally reviewed and evaluated these images and lab results as part of my medical decision-making.   EKG Interpretation None      MDM   Final diagnoses:  Foot injury, right, initial encounter    Ankle XR reveals  soft tissue swelling but no e/o fracture. Pt is able to bear weight in the ED though it is painful. Some relief with ice pack and toradol IM. Discussed findings with pt. ACE wrap provided for compression and pain relief. Pt declines crutches. Encouraged RICE therapy. Rx given for naproxen and tylenol. Ortho referral given. ER return precautions given. Pt verbalized understanding and agreement. He is stable for discharge.    Carlene Coria, PA-C 12/23/15 1009  Doug Sou, MD 12/23/15 [phone]

## 2015-12-22 NOTE — Discharge Instructions (Signed)
Your x-ray showed evidence of swelling but no bone fractures. We helped wrap your ankle/foot today. You may take naproxen and tylenol as needed for pain and swelling. RICE therapy (rest, compression, ice, elevation). Please follow up with orthopedics next week for re-evaluation. Return to the ER for new or worsening symptoms.

## 2015-12-22 NOTE — ED Notes (Signed)
Pt reports he fell down a hill and turned RT ankle. Pt now has swelling to lateral aspect of RT ankle. Pt observed ambulating to room with a limp.

## 2016-02-07 IMAGING — CT CT HEAD W/O CM
1 of 2 series · 15 of 30 positions shown, 19 images · non-contrast
Comparison: CT of the maxillofacial structures performed 03/23/2014

CLINICAL DATA: Tripped over a tree, and hit head on corner of metal
sign. Fell backward and hit head on concrete. Loss of consciousness.
Laceration on the left side of the head. Initial encounter.

EXAM:
CT HEAD WITHOUT CONTRAST
TECHNIQUE: Contiguous axial images were obtained from the base of the skull
through the vertex without intravenous contrast.

[Series 3: head 2.0 h70h · axial · 0.44mm/px · z∈[+1287,+1437]mm · 15 of 85 slices shown, 19 images]
[im 5/85  brain]
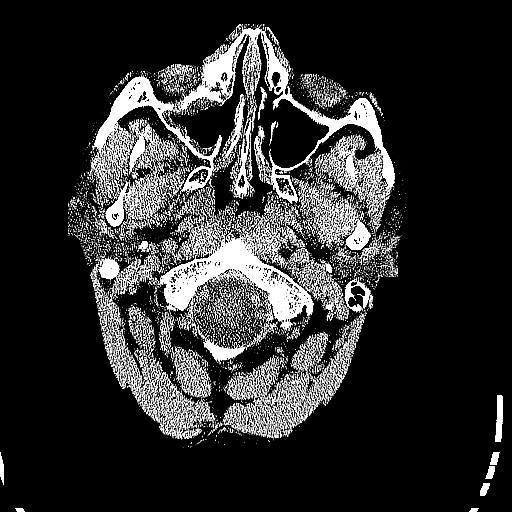
[im 5/85  bone]
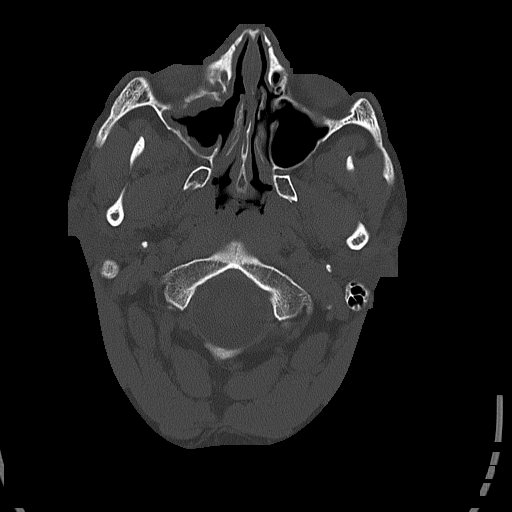
[im 9/85  brain]
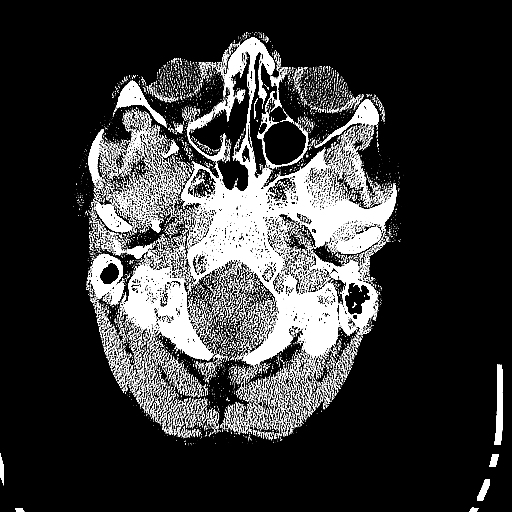
[im 17/85  brain]
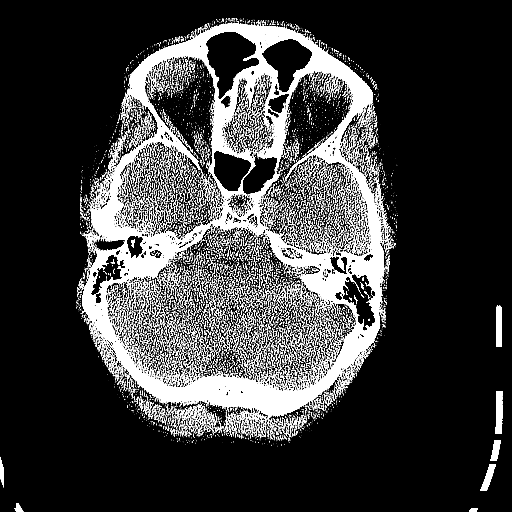
[im 22/85  brain]
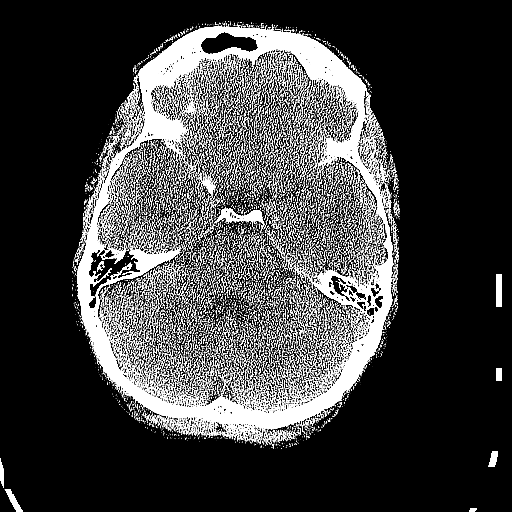
[im 26/85  brain]
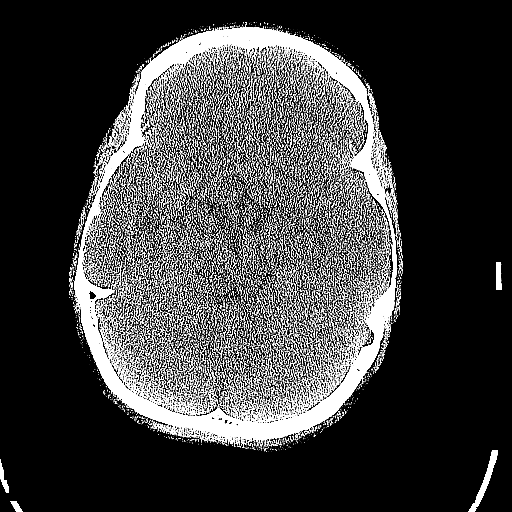
[im 26/85  bone]
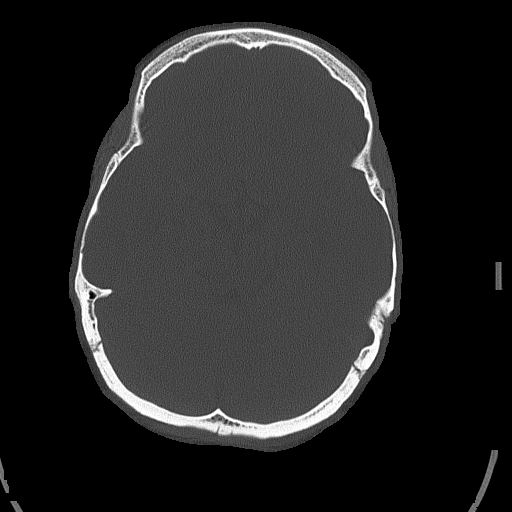
[im 30/85  brain]
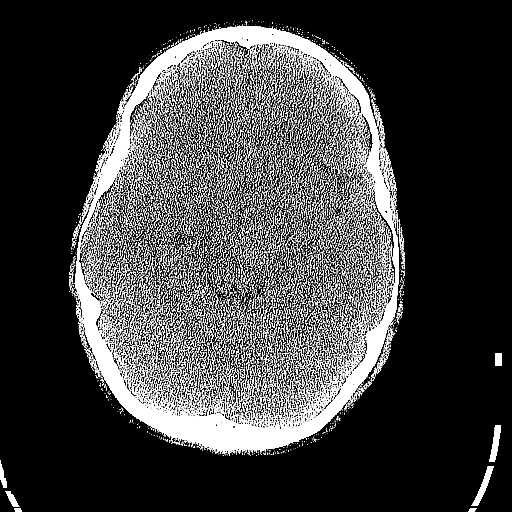
[im 38/85  brain]
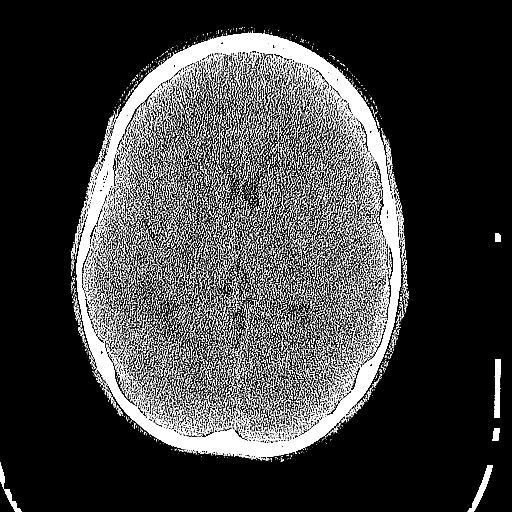
[im 43/85  brain]
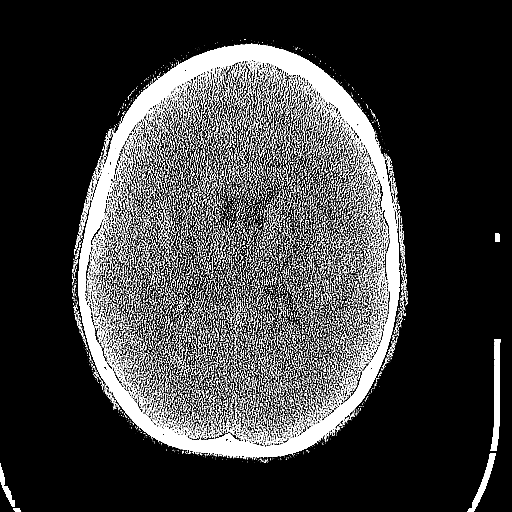
[im 47/85  brain]
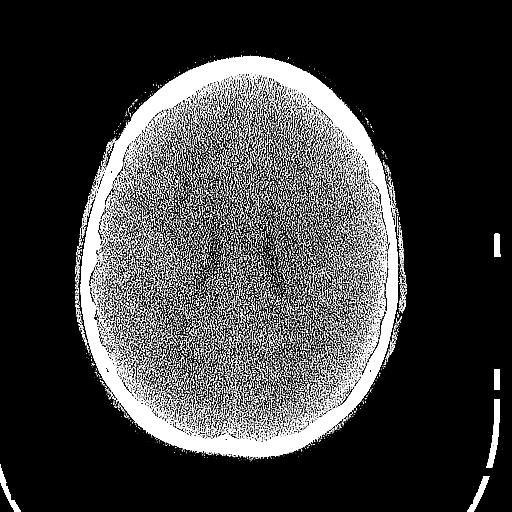
[im 47/85  bone]
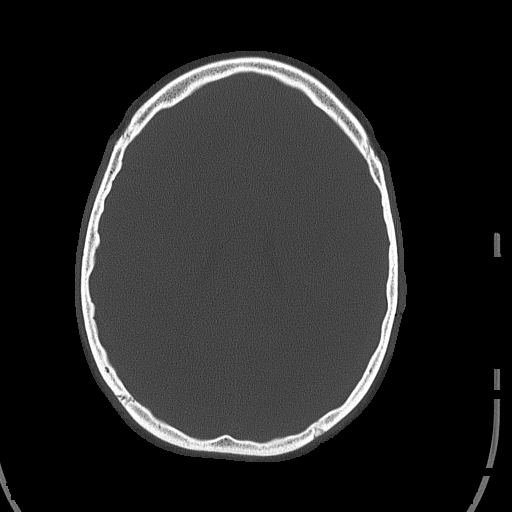
[im 55/85  brain]
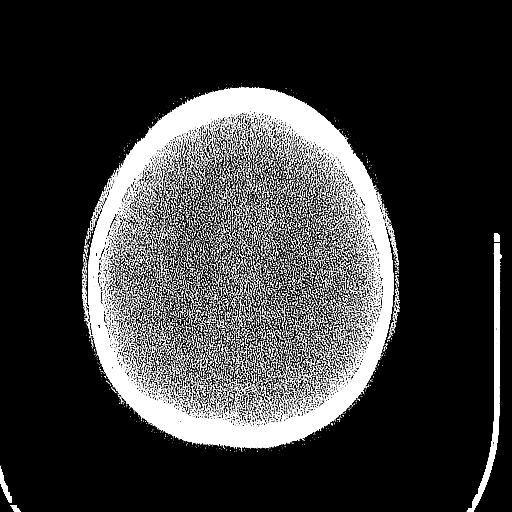
[im 59/85  brain]
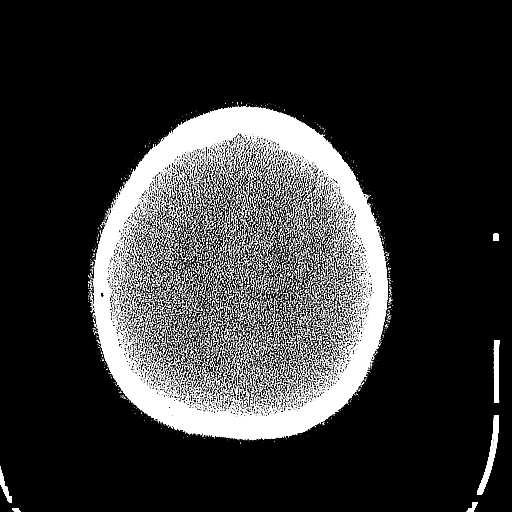
[im 64/85  brain]
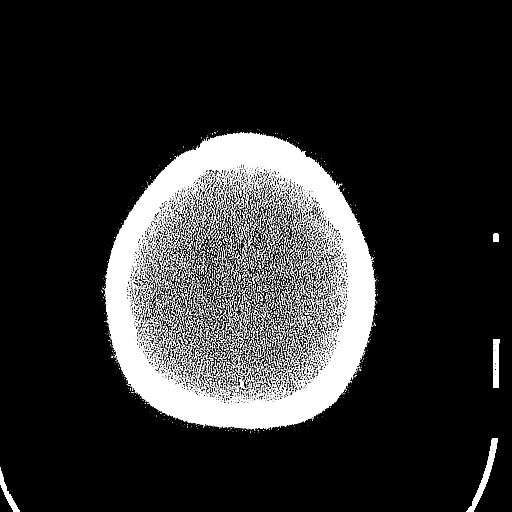
[im 68/85  brain]
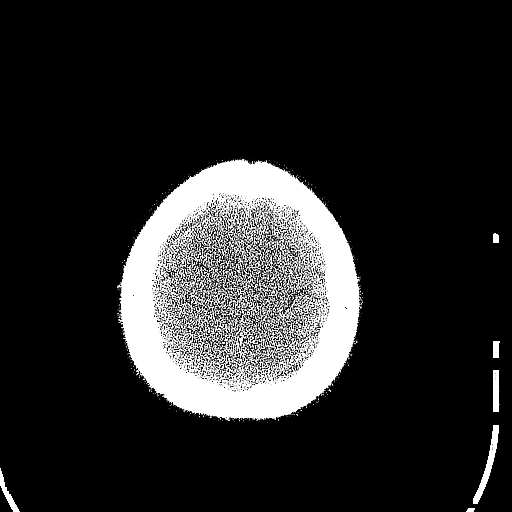
[im 68/85  bone]
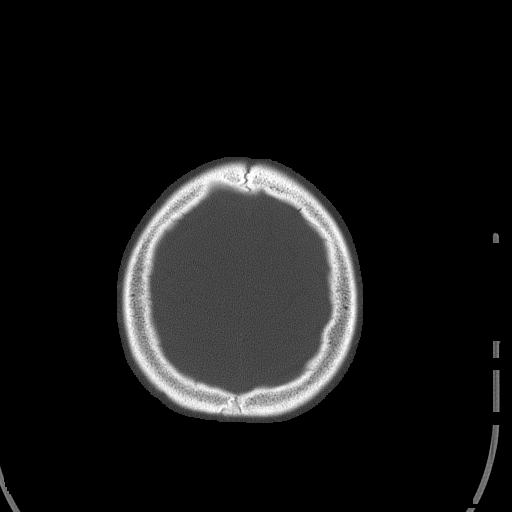
[im 76/85  brain]
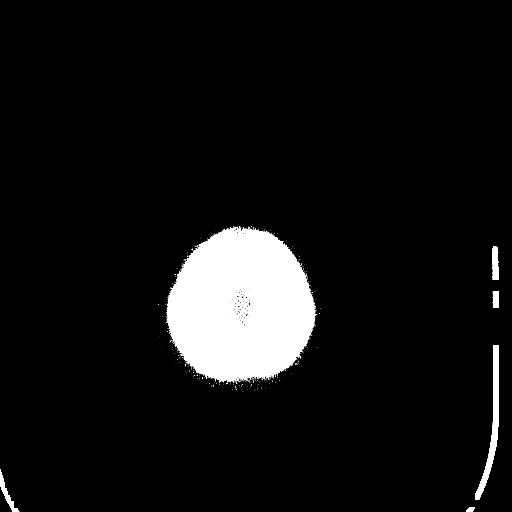
[im 80/85  brain]
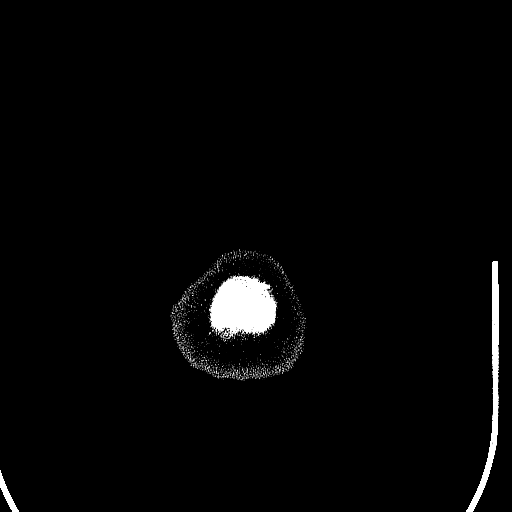

[15 of 30 positions shown; findings below may reference images not displayed]

FINDINGS: There is no evidence of acute infarction, mass lesion, or intra- or
extra-axial hemorrhage on CT.

The posterior fossa, including the cerebellum, brainstem and fourth
ventricle, is within normal limits. The third and lateral
ventricles, and basal ganglia are unremarkable in appearance. The
cerebral hemispheres are symmetric in appearance, with normal
gray-white differentiation. No mass effect or midline shift is seen.

There is no evidence of fracture; visualized osseous structures are
unremarkable in appearance. The visualized portions of the orbits
are within normal limits. Mucosal thickening is noted at the right
maxillary sinus; the patient is status post right-sided maxillary
antrostomy. The remaining paranasal sinuses and mastoid air cells
are well-aerated. A large soft tissue laceration is noted overlying
the left parietal calvarium, with air tracking to the level of the
calvarium.
IMPRESSION: 1. No evidence of traumatic intracranial injury or fracture.
2. Large soft tissue laceration overlying the left parietal
calvarium, with air tracking to the level of the calvarium.
3. Mucosal thickening at the right maxillary sinus.

## 2016-09-17 IMAGING — DX DG ANKLE COMPLETE 3+V*R*
3 series · 3 of 3 positions shown · non-contrast
Comparison: None.

CLINICAL DATA: Tripped walking down sidewalk today. Fall. Rolled
right ankle with lateral pain and swelling. Initial encounter.

EXAM:
RIGHT ANKLE - COMPLETE 3+ VIEW

[foot ap]
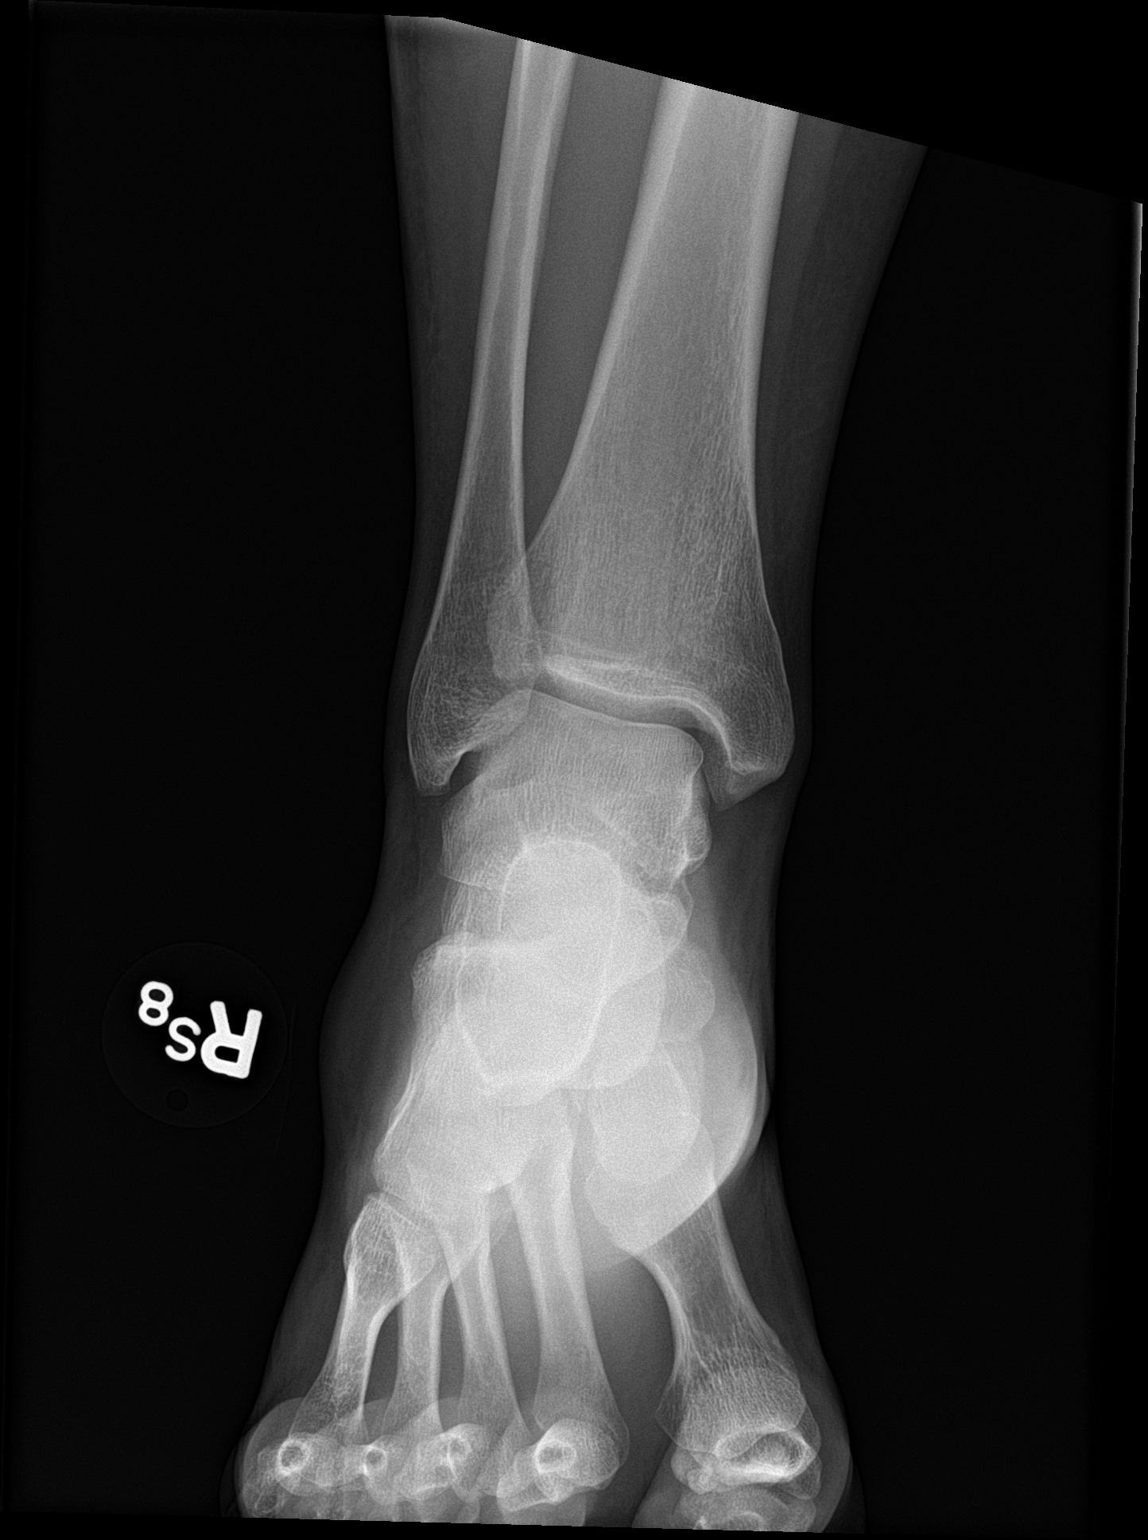

[foot obl]
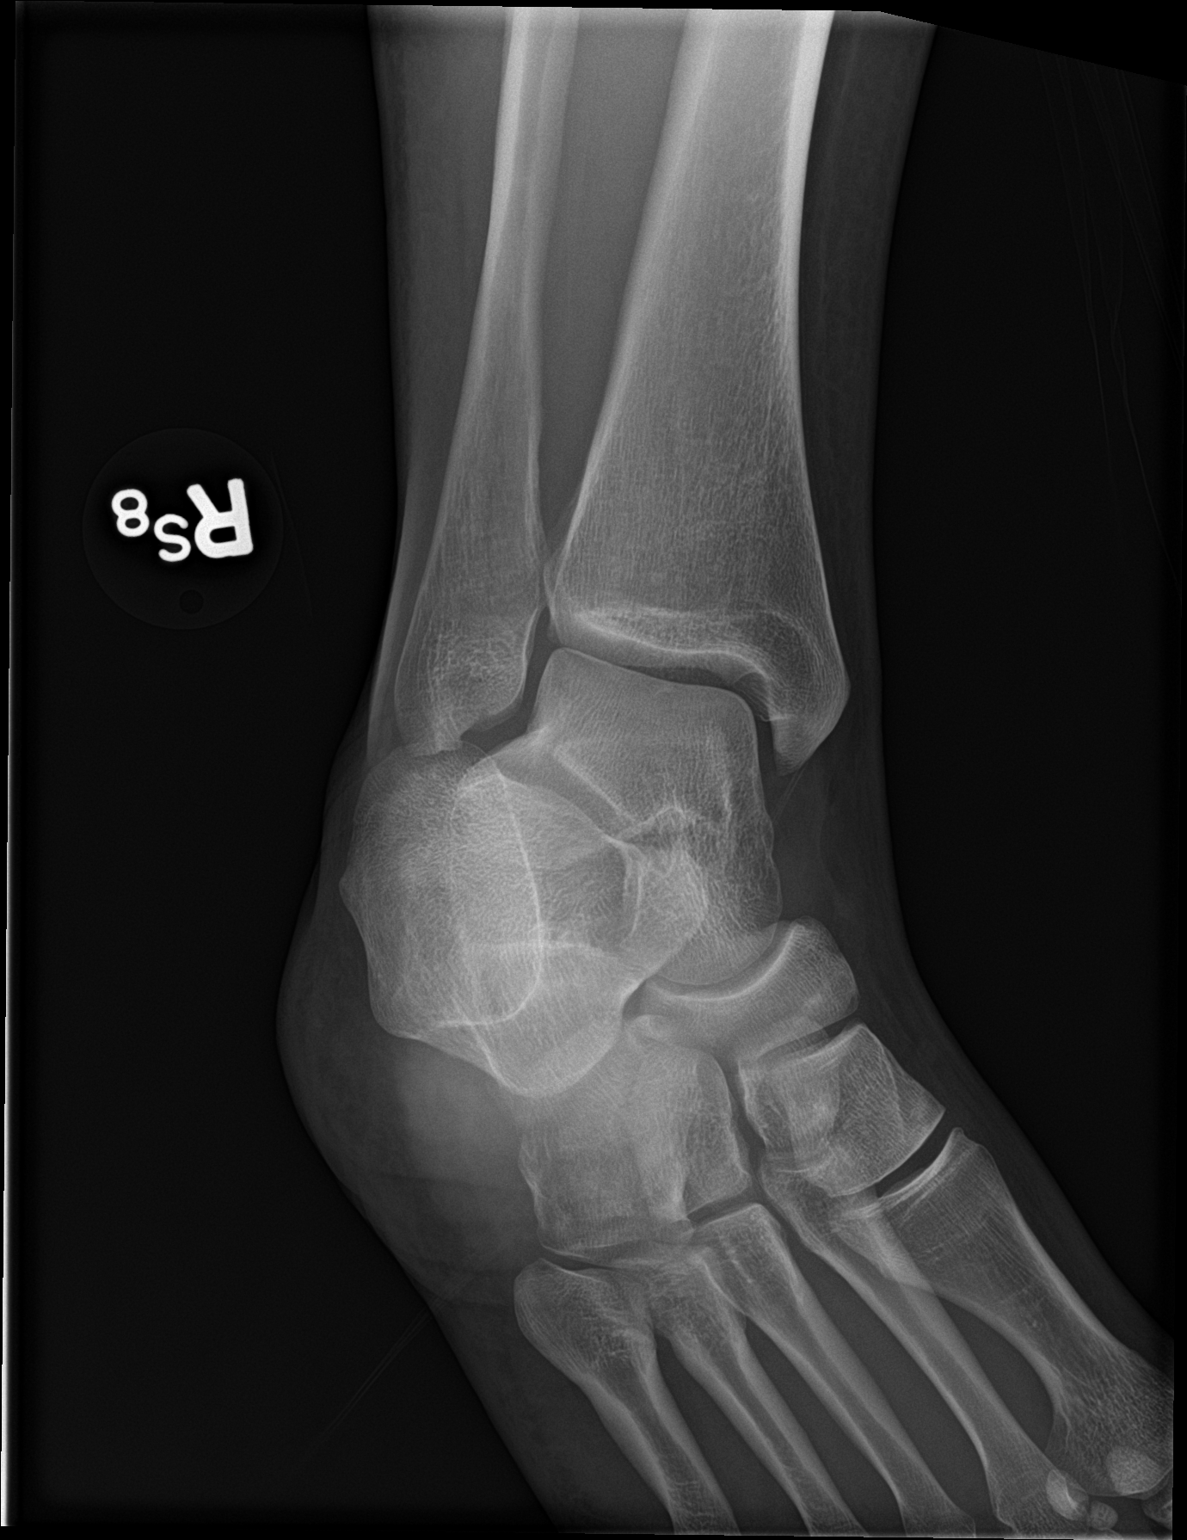

[foot lat]
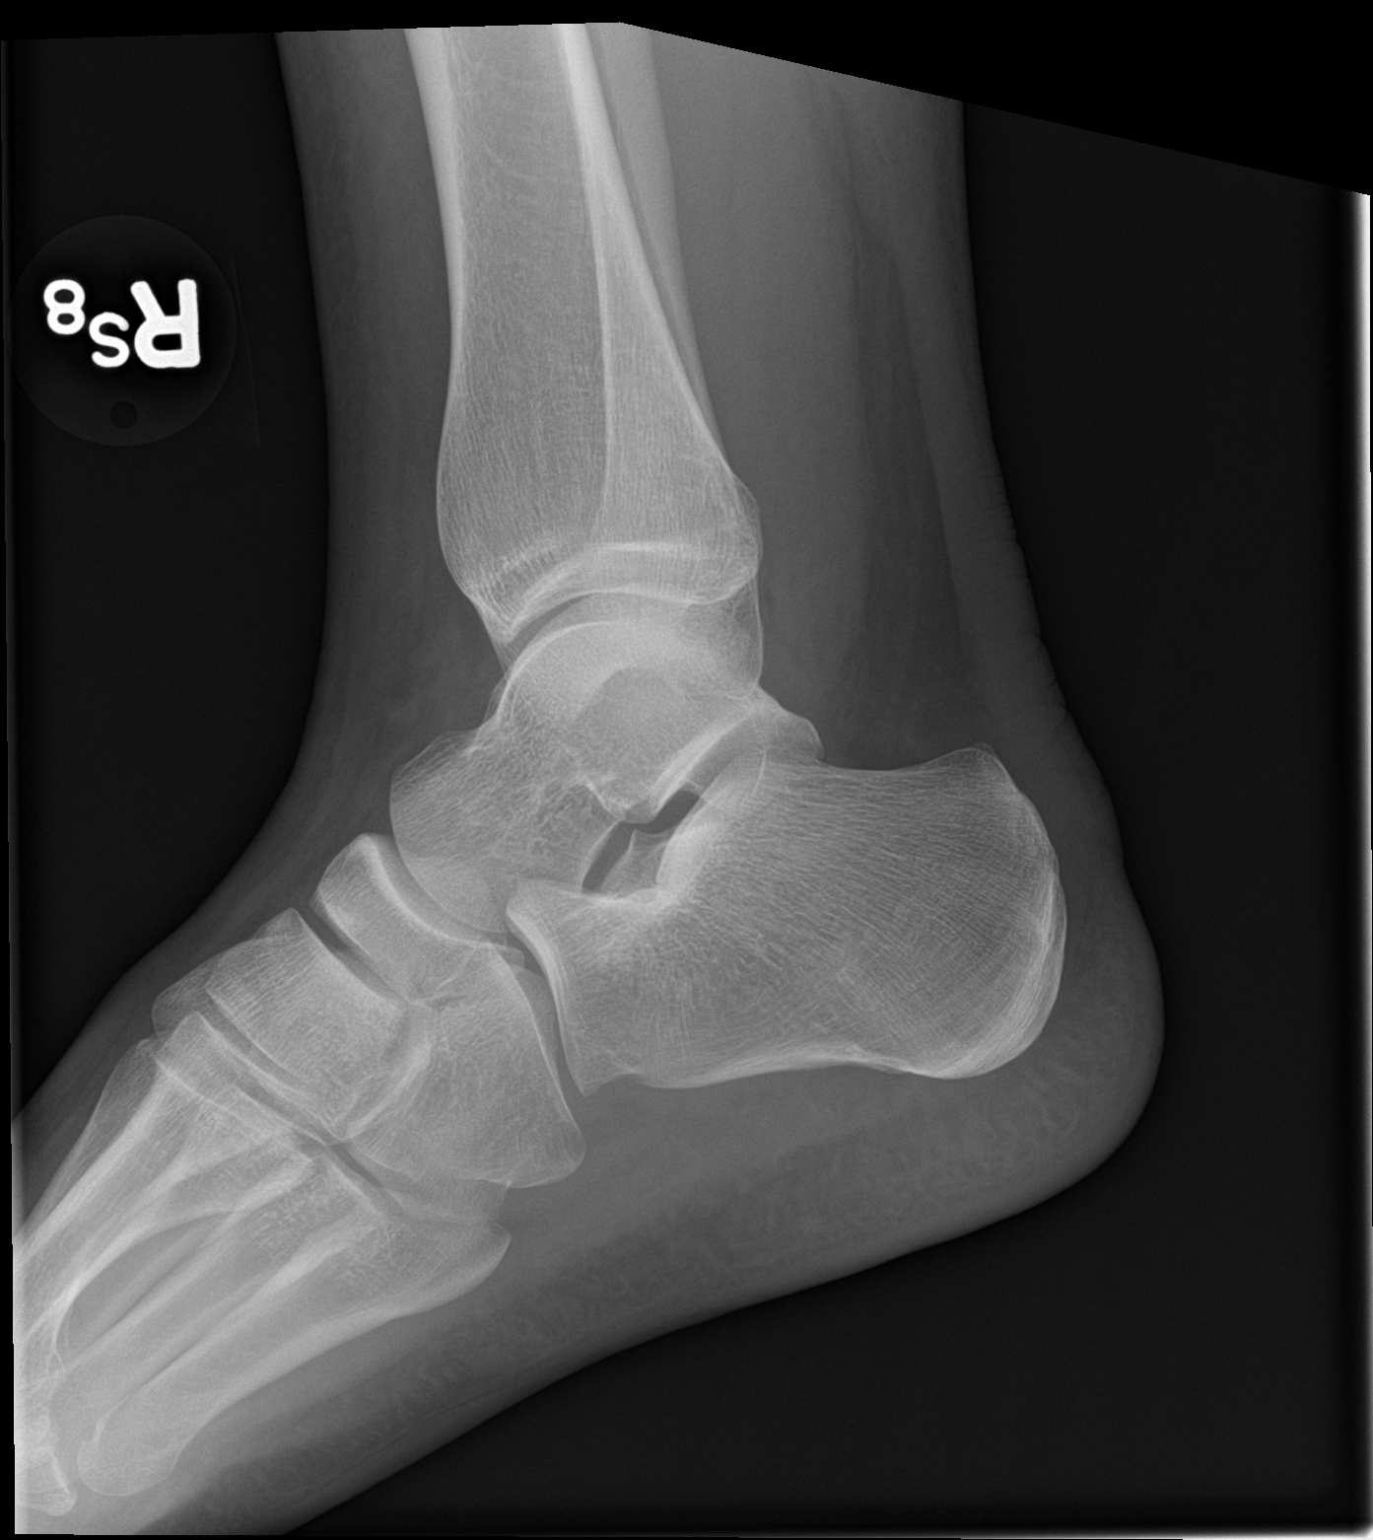

[3 of 3 positions shown; findings below may reference images not displayed]

FINDINGS: There is soft tissue swelling about the lateral aspect of the
hindfoot/ midfoot. The ankle mortise is intact. No acute fracture or
dislocation is identified. No lytic or blastic osseous lesion or
radiopaque foreign body is seen.
IMPRESSION: Soft tissue swelling without acute osseous abnormality identified.
